# Patient Record
Sex: Male | Born: 1970 | Hispanic: No | Marital: Single | Smoking: Current every day smoker
Health system: Southern US, Community
[De-identification: ages and names within clinical notes are randomized; demographics above are authoritative.]

## PROBLEM LIST (undated history)

## (undated) DIAGNOSIS — J449 Chronic obstructive pulmonary disease, unspecified: Secondary | ICD-10-CM

## (undated) DIAGNOSIS — E785 Hyperlipidemia, unspecified: Secondary | ICD-10-CM

## (undated) DIAGNOSIS — IMO0002 Reserved for concepts with insufficient information to code with codable children: Secondary | ICD-10-CM

## (undated) DIAGNOSIS — G8929 Other chronic pain: Secondary | ICD-10-CM

## (undated) DIAGNOSIS — R454 Irritability and anger: Secondary | ICD-10-CM

## (undated) DIAGNOSIS — K649 Unspecified hemorrhoids: Secondary | ICD-10-CM

## (undated) DIAGNOSIS — G4730 Sleep apnea, unspecified: Secondary | ICD-10-CM

## (undated) DIAGNOSIS — F1910 Other psychoactive substance abuse, uncomplicated: Secondary | ICD-10-CM

## (undated) DIAGNOSIS — M5090 Cervical disc disorder, unspecified, unspecified cervical region: Secondary | ICD-10-CM

## (undated) DIAGNOSIS — J45909 Unspecified asthma, uncomplicated: Secondary | ICD-10-CM

## (undated) DIAGNOSIS — I10 Essential (primary) hypertension: Secondary | ICD-10-CM

## (undated) DIAGNOSIS — E559 Vitamin D deficiency, unspecified: Secondary | ICD-10-CM

## (undated) DIAGNOSIS — K219 Gastro-esophageal reflux disease without esophagitis: Secondary | ICD-10-CM

## (undated) DIAGNOSIS — M1990 Unspecified osteoarthritis, unspecified site: Secondary | ICD-10-CM

## (undated) HISTORY — DX: Other chronic pain: G89.29

## (undated) HISTORY — DX: Essential (primary) hypertension: I10

## (undated) HISTORY — DX: Irritability and anger: R45.4

## (undated) HISTORY — DX: Unspecified osteoarthritis, unspecified site: M19.90

## (undated) HISTORY — PX: UPPER GASTROINTESTINAL ENDOSCOPY: SHX188

## (undated) HISTORY — DX: Other psychoactive substance abuse, uncomplicated: F19.10

## (undated) HISTORY — DX: Reserved for concepts with insufficient information to code with codable children: IMO0002

## (undated) HISTORY — DX: Gastro-esophageal reflux disease without esophagitis: K21.9

## (undated) HISTORY — DX: Cervical disc disorder, unspecified, unspecified cervical region: M50.90

## (undated) HISTORY — DX: Vitamin D deficiency, unspecified: E55.9

## (undated) HISTORY — DX: Unspecified asthma, uncomplicated: J45.909

## (undated) HISTORY — DX: Sleep apnea, unspecified: G47.30

## (undated) HISTORY — DX: Chronic obstructive pulmonary disease, unspecified: J44.9

## (undated) HISTORY — DX: Hyperlipidemia, unspecified: E78.5

## (undated) HISTORY — DX: Unspecified hemorrhoids: K64.9

---
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
## 1994-03-08 HISTORY — PX: ANKLE SURGERY: SHX546

## 1994-03-08 HISTORY — PX: LEG SURGERY: SHX1003

## 2007-06-21 ENCOUNTER — Emergency Department (HOSPITAL_COMMUNITY): Admission: EM | Admit: 2007-06-21 | Discharge: 2007-06-22 | Payer: Self-pay | Admitting: Emergency Medicine

## 2008-06-23 ENCOUNTER — Observation Stay (HOSPITAL_COMMUNITY): Admission: EM | Admit: 2008-06-23 | Discharge: 2008-06-24 | Payer: Self-pay | Admitting: Emergency Medicine

## 2010-02-06 IMAGING — CT CT HEAD W/O CM
1 series · 16 of 30 positions shown, 20 images · non-contrast
Comparison: None

CLINICAL DATA: Syncope

CT HEAD WITHOUT CONTRAST
TECHNIQUE: Contiguous axial images were obtained from the base of
the skull through the vertex without contrast.

[Series 2: head routine 4.8 h37s · axial · 0.51mm/px · z∈[-87,+54]mm · 16 of 30 slices shown, 20 images]
[im 2/30  brain]
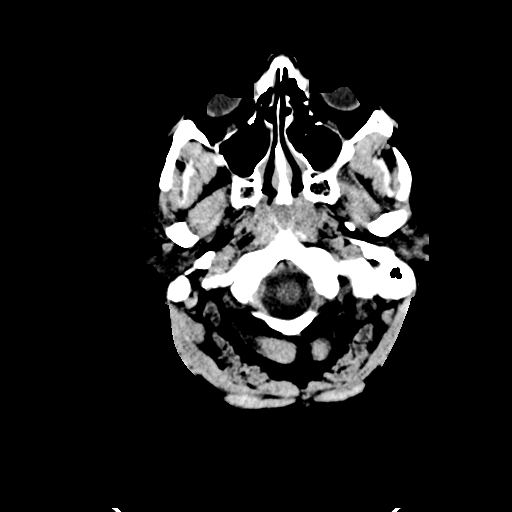
[im 2/30  bone]
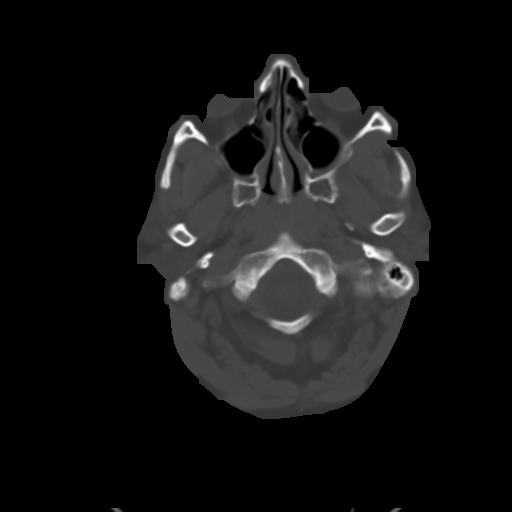
[im 4/30  brain]
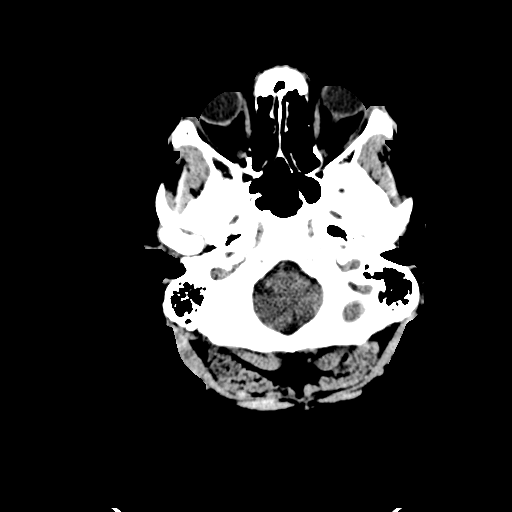
[im 6/30  brain]
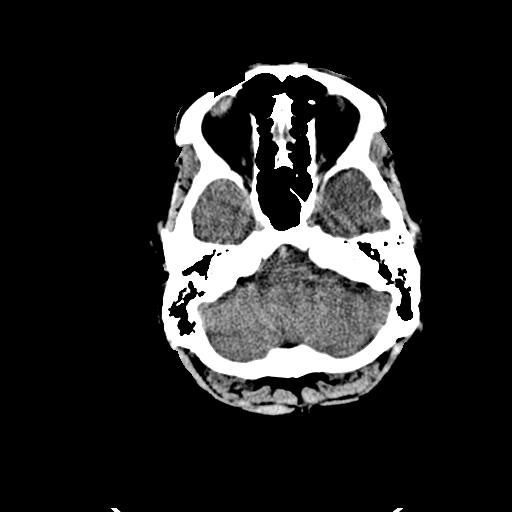
[im 8/30  brain]
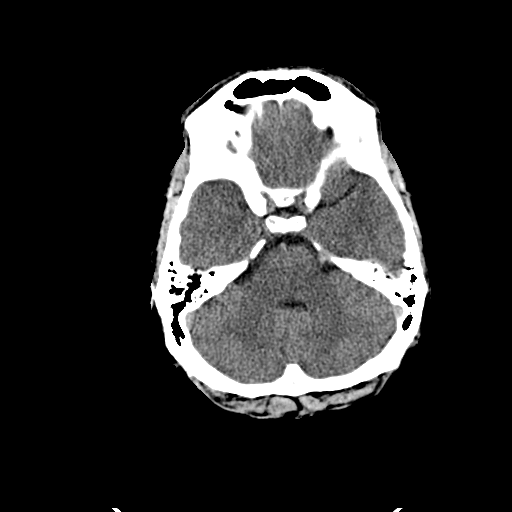
[im 9/30  brain]
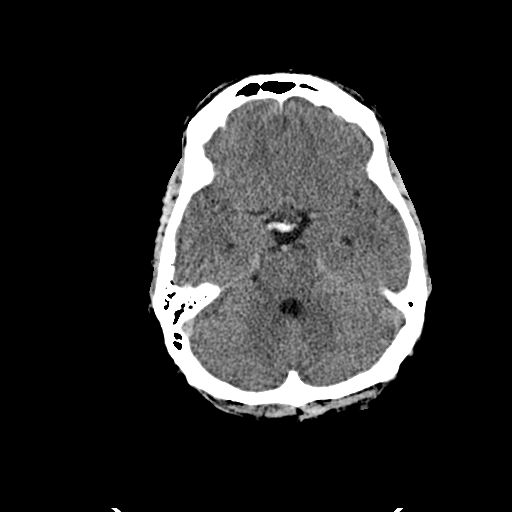
[im 9/30  bone]
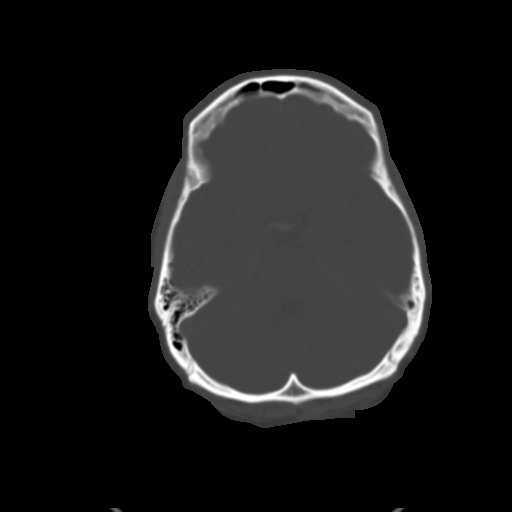
[im 11/30  brain]
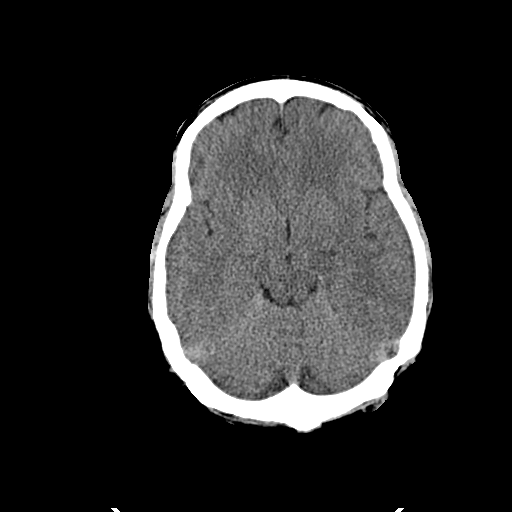
[im 13/30  brain]
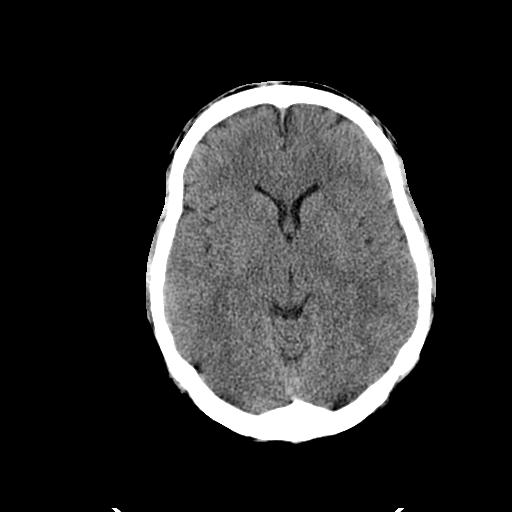
[im 15/30  brain]
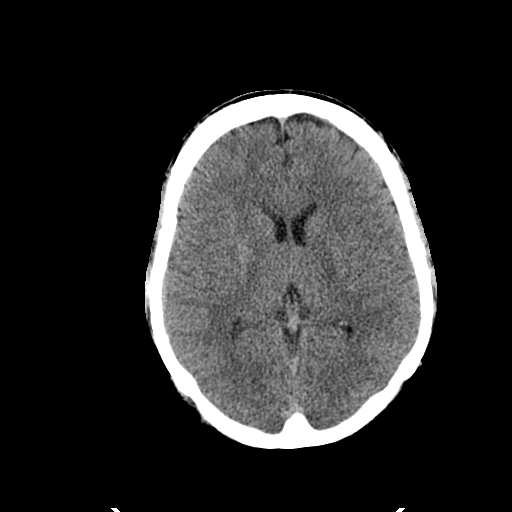
[im 16/30  brain]
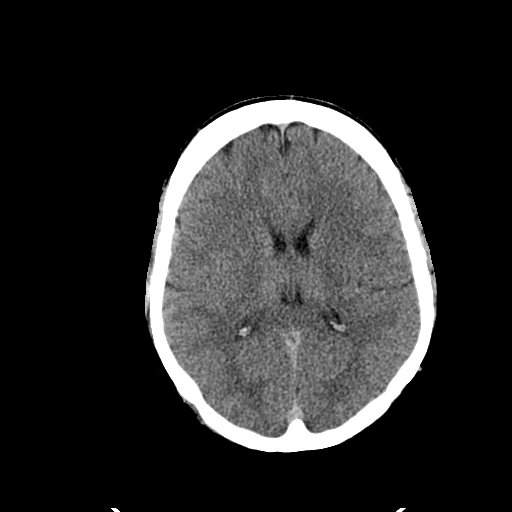
[im 16/30  bone]
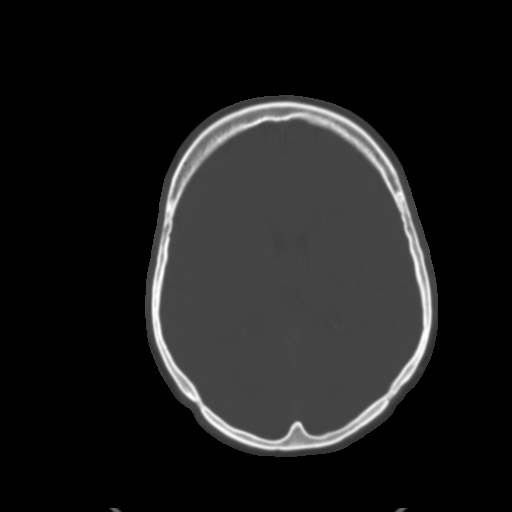
[im 18/30  brain]
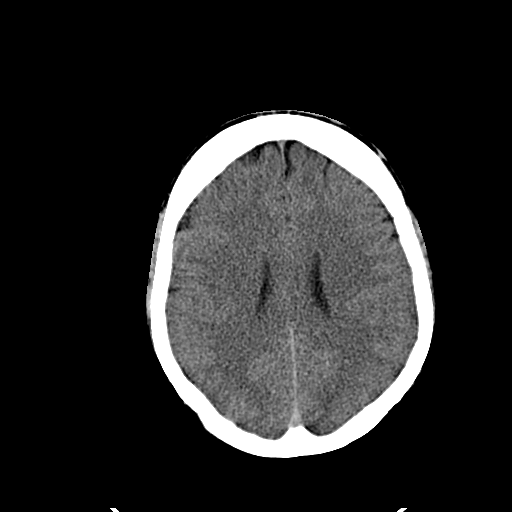
[im 20/30  brain]
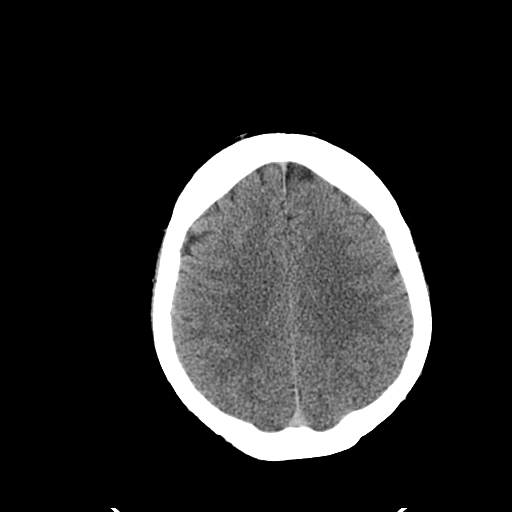
[im 22/30  brain]
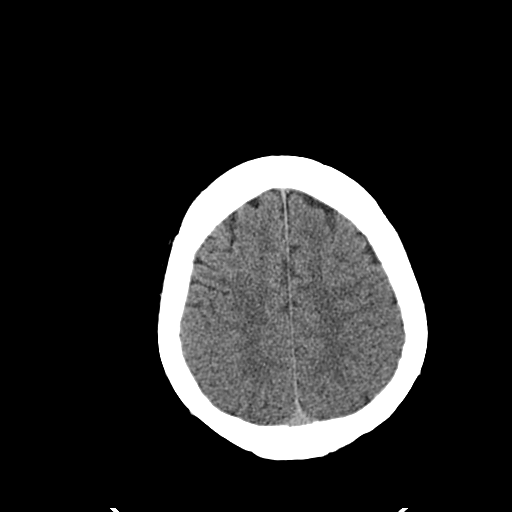
[im 23/30  brain]
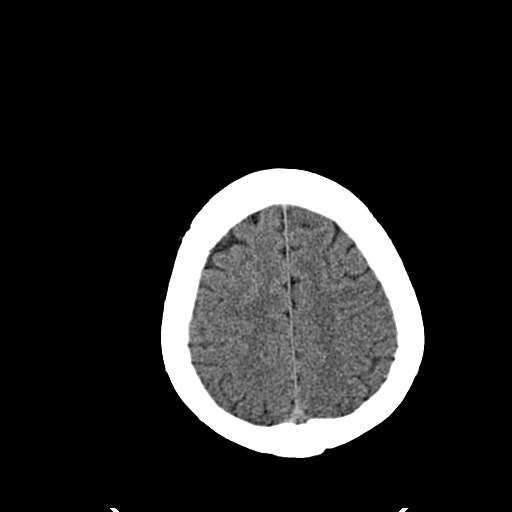
[im 23/30  bone]
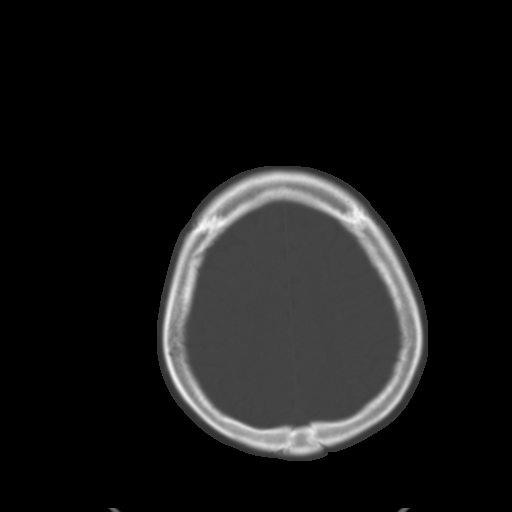
[im 25/30  brain]
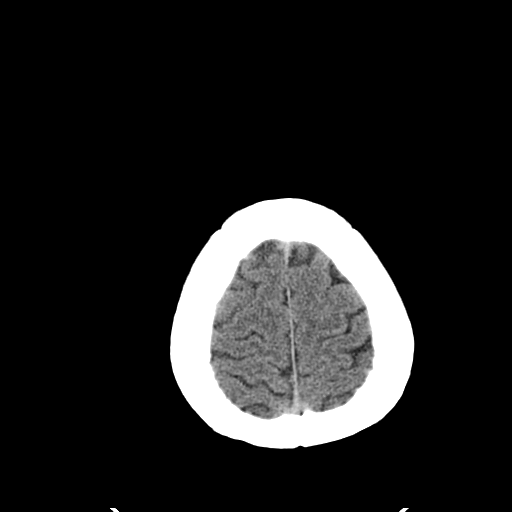
[im 27/30  brain]
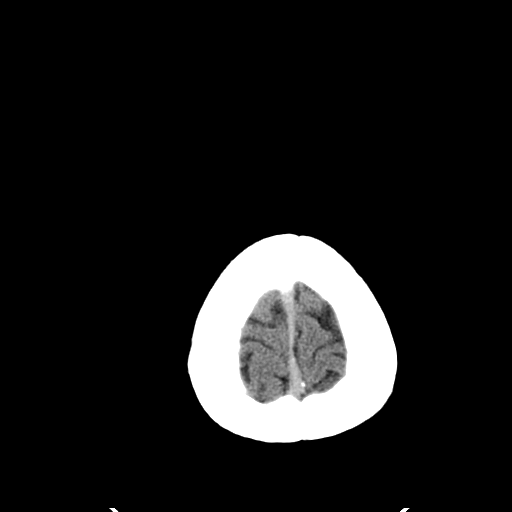
[im 29/30  brain]
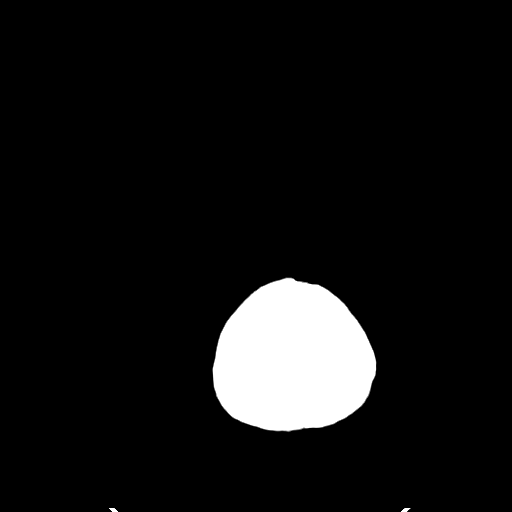

[16 of 30 positions shown; findings below may reference images not displayed]

FINDINGS: No depressed skull fracture is noted.  No intracranial
hemorrhage, mass effect or midline shift.

Paranasal sinuses and mastoid air cells are unremarkable.  No intra
or extra-axial fluid collection.  No mass lesion is noted.  No
definite acute cortical infarct.
IMPRESSION: No acute intracranial abnormality.

## 2010-06-17 LAB — LIPID PANEL
Total CHOL/HDL Ratio: 6.4 RATIO
Triglycerides: 158 mg/dL — ABNORMAL HIGH (ref <150)
VLDL: 32 mg/dL (ref 0–40)

## 2010-06-17 LAB — RAPID URINE DRUG SCREEN, HOSP PERFORMED
Barbiturates: NOT DETECTED
Benzodiazepines: NOT DETECTED
Cocaine: NOT DETECTED
Opiates: NOT DETECTED

## 2010-06-17 LAB — CBC
HCT: 41.7 % (ref 39.0–52.0)
Hemoglobin: 15.3 g/dL (ref 13.0–17.0)
MCHC: 35 g/dL (ref 30.0–36.0)
MCV: 94.7 fL (ref 78.0–100.0)
Platelets: 241 10*3/uL (ref 150–400)
RBC: 4.67 MIL/uL (ref 4.22–5.81)
RDW: 13.8 % (ref 11.5–15.5)
WBC: 8.6 10*3/uL (ref 4.0–10.5)

## 2010-06-17 LAB — POCT CARDIAC MARKERS
CKMB, poc: <1 ng/mL — ABNORMAL LOW (ref 1.0–8.0)
Myoglobin, poc: 67.9 ng/mL (ref 12–200)

## 2010-06-17 LAB — CARDIAC PANEL(CRET KIN+CKTOT+MB+TROPI)
Relative Index: INVALID (ref 0.0–2.5)
Troponin I: 0.04 ng/mL (ref 0.00–0.06)

## 2010-06-17 LAB — URINALYSIS, ROUTINE W REFLEX MICROSCOPIC
Bilirubin Urine: NEGATIVE
Hgb urine dipstick: NEGATIVE
Hgb urine dipstick: NEGATIVE
Ketones, ur: 15 mg/dL — AB
Ketones, ur: NEGATIVE mg/dL
Nitrite: NEGATIVE
Protein, ur: 30 mg/dL — AB
Protein, ur: NEGATIVE mg/dL
Urobilinogen, UA: 1 mg/dL (ref 0.0–1.0)
Urobilinogen, UA: 1 mg/dL (ref 0.0–1.0)

## 2010-06-17 LAB — BRAIN NATRIURETIC PEPTIDE: Pro B Natriuretic peptide (BNP): <30 pg/mL (ref 0.0–100.0)

## 2010-06-17 LAB — BASIC METABOLIC PANEL
CO2: 22 mEq/L (ref 19–32)
Calcium: 8.7 mg/dL (ref 8.4–10.5)
Chloride: 104 mEq/L (ref 96–112)
Chloride: 108 mEq/L (ref 96–112)
Creatinine, Ser: 1.01 mg/dL (ref 0.4–1.5)
GFR calc Af Amer: >60 mL/min (ref >60)
GFR calc Af Amer: >60 mL/min (ref >60)
GFR calc non Af Amer: >60 mL/min (ref >60)
Potassium: 3.6 mEq/L (ref 3.5–5.1)
Sodium: 134 mEq/L — ABNORMAL LOW (ref 135–145)

## 2010-06-17 LAB — CORTISOL-AM, BLOOD: Cortisol - AM: 3.1 ug/dL — ABNORMAL LOW (ref 4.3–22.4)

## 2010-06-17 LAB — URINE CULTURE
Colony Count: NO GROWTH
Culture: NO GROWTH

## 2010-06-17 LAB — TSH: TSH: 0.529 u[IU]/mL (ref 0.350–4.500)

## 2010-06-17 LAB — TROPONIN I: Troponin I: 0.05 ng/mL (ref 0.00–0.06)

## 2010-06-17 LAB — DIFFERENTIAL
Basophils Relative: 1 % (ref 0–1)
Monocytes Absolute: 0.9 10*3/uL (ref 0.1–1.0)
Monocytes Relative: 7 % (ref 3–12)
Neutro Abs: 10.8 10*3/uL — ABNORMAL HIGH (ref 1.7–7.7)

## 2010-06-17 LAB — SALICYLATE LEVEL: Salicylate Lvl: <4 mg/dL (ref 2.8–20.0)

## 2010-06-17 LAB — MAGNESIUM: Magnesium: 2.2 mg/dL (ref 1.5–2.5)

## 2010-06-17 LAB — HEMOCCULT GUIAC POC 1CARD (OFFICE): Fecal Occult Bld: NEGATIVE

## 2010-06-17 LAB — APTT: aPTT: 30 seconds (ref 24–37)

## 2010-06-17 LAB — URINE MICROSCOPIC-ADD ON

## 2010-07-21 NOTE — Discharge Summary (Signed)
NAME:  Ryan Carpenter, Ryan Carpenter NO.:  [id number]   MEDICAL RECORD NO.:  [id number]          PATIENT TYPE:  OBV   LOCATION:  3732                         FACILITY:  [facility]   PHYSICIAN:  Monte Fantasia, MD  DATE OF BIRTH:  1970-08-17   DATE OF ADMISSION:  06/23/2008  DATE OF DISCHARGE:  06/24/2008                               DISCHARGE SUMMARY   PRIMARY CARE PHYSICIAN:  The patient follows up with [facility] but  currently is unassigned.   DISCHARGE DIAGNOSES:  1. Dehydration.  2. Syncope, possibly secondary to dehydration.  3. Acute renal insufficiency, which has resolved.  4. Migraine headaches.   DISCHARGE MEDICATIONS:  1. Prevacid 30 mg p.o. daily for 4 weeks.  2. Amitriptyline 25 mg p.o. nightly p.r.n. migraine headaches.   COURSE DURING THE HOSPITAL STAY:  Mr. Ryan Carpenter is a 40 year old African  American veteran.  The present was admitted on June 23, 2008, with  complaints of a syncopal episode.  The patient stated that he took  medications, which was Exforge, amlodipine 5 mg and valsartan 160 mg,  for his headache when given by his father and later had felt uneasy  throughout the day and felt dizzy when watching the television with  lightheadedness.  The patient on arrival to the [facility] Emergency,  blood pressure was in 80s/40s.  The patient was given aggressively IV  fluid hydration, which brought his blood pressure up to 112/66.  The  patient was admitted for telemetry monitoring and was positive on his  orthostatics.  The patient had no telemetry events over the past 24  hours.  Cardiac enzymes cycled during the stay in the hospital, 3 sets  have been negative.  Also, the patient had elevated white count on  admission, which could be possibly secondary to dehydration, which has  resolved at present.  H and H have been stable through the stay in the  hospital, and renal insufficiency improved with IV fluids from 1.5 to 1.  The patient at present does  not feel any episodes of lightheadedness or  dizziness and had had no telemetry events through the stay in the  hospital.  At present, the patient is medically stable to be discharged  and can be discharged home.  The patient is recommended to follow up  with [facility] or a primary care physician in the next 1 week.   LABORATORY DATA DONE DURING THE STAY IN THE HOSPITAL:  Total WBC 8.6,  improved from 14; hemoglobin 14.4; hematocrit 41.7; platelets of 241.  Sodium 138, potassium 3.6, chloride 108, bicarb 26, glucose 106, BUN 12,  creatinine 1, calcium 8.7, magnesium 2.2.  Cardiac enzymes x3 sets have  been negative.  HIV nonreactive.  Total cholesterol 191, triglycerides  158, HDL 30, LDL 129.  TSH 0.529.  The patient's UTOX was positive for  marijuana.  UA has been negative.   RADIOLOGICAL INVESTIGATION DONE DURING THE STAY IN THE HOSPITAL:  1. CT head done on June 23, 2008.  Impression:  No acute intracranial abnormality.  1. Chest x-ray done on June 23, 2008.  Impression:  No acute cardiopulmonary events.   PHYSICAL EXAMINATION:  VITAL SIGNS:  Temperature of 96.8, heart rate of  69, respirations 18, blood pressure 120/71.  NECK:  Supple.  HEENT:  Pupils equal, reacting to light.  No pallor.  No  lymphadenopathy.  RESPIRATORY:  Air entry is bilaterally equal.  No rales.  No rhonchi.  CARDIOVASCULAR:  S1 and S2 regular rate and rhythm.  ABDOMEN:  Soft.  No guarding or rigidity.  No tenderness.  EXTREMITIES:  No edema of feet.  CENTRAL NERVOUS SYSTEM:  The patient is alert, awake, oriented x3.  No  focal neurological deficits.  SKIN:  Intact.  No evidence of rashes.   DISPOSITION:  The patient at present is medically stable to be  discharged and recommended to follow up with [facility] for a  primary care physician, and the patient has acknowledged the same.  The  patient is no longer orthostatic.  Possibly syncopal episode was  secondary to dehydration.    TOTAL TIME FOR DISCHARGE:  35 minutes.      Monte Fantasia, MD  Electronically Signed     MP/MEDQ  D:  06/24/2008  T:  06/25/2008  Job:  [id number]

## 2010-07-21 NOTE — H&P (Signed)
NAME:  Ryan Carpenter, Ryan Carpenter NO.:  [id number]   MEDICAL RECORD NO.:  [id number]          PATIENT TYPE:  EMS   LOCATION:  MAJO                         FACILITY:  [facility]   PHYSICIAN:  Acey Lav, MD  DATE OF BIRTH:  02/09/1971   DATE OF ADMISSION:  06/23/2008  DATE OF DISCHARGE:                              HISTORY & PHYSICAL   He does not have a primary care physician.  He was previously seen by  the [facility] health care system in [place] and in [place].   CHIEF COMPLAINT:  Syncopal episode.   HISTORY OF PRESENT ILLNESS:  Ryan Carpenter is a 40 year old [place] Native  American veteran who presents with syncope this morning.  He has had  four prior episodes of syncope that have happened over the last 10  years.  This has been worked up at the [place], but according to the patient,  not as an inpatient.  He claims he has had MRIs of his brain performed  and various other tests.  He suffers from chronic migraine headaches as  well.  Of note, the patient states that approximately 10 years ago when  he had his first syncopal episode, he had been on the couch after a day  of being out in the sun and was dehydrated.  He was getting up from the  couch to walk and he felt suddenly diaphoretic and dizzy and passed out.   He has had three additional episodes of syncope.  One occurred when he  was walking down his hallway and he suddenly passed out.  Another  occurred when he was in the woods fishing with friends.  At that point  in time it was hot in the summer and he thinks he may have been  dehydrated.  He again became profusely diaphoretic and lost  consciousness.  This morning he passed out once again.  Recently he has  had severe migraine headaches and has been taking Excedrin up to 10-15  times a day.  He currently has not been able to take any prophylactic  medications (he used to take Verapamil) and has not been able to afford  other medications such as Imitrex which he  states used to work well for  his migraine headaches.  His migraine headaches have been 10/10 in  severity, relieved with Excedrin although as mentioned, he has required  up to 10-14 a day to apparently relieve this.  He has had frequent  vomiting over the last several weeks and at times daily vomiting.  Vomitus usually contains mucous but occasionally has flecks of blood in  it.  He vomited last night.  This morning he was having severe headaches  again.  His father came to get him to go fishing.  His father suggested  that he take his father's prescription medication Exforge, which is 5 mg  of amlodipine and 160 mg of Valsartan.  After taking this he proceeded  to go on the fishing trip with his father.  He started feeling dizzy and  lightheaded.  He felt unwell, felt queasy, nauseous and told his father  he did not feel well enough to fish.  He returned home to his house and  was watching a commercial on TV when he again felt dizzy and  lightheaded.  He stood up to walk out of the house and as walked toward  his wife started having dizziness and seeing the room shrink and get  larger again.  He went back to the couch in the house and lay back in  the recliner.  Apparently at this point in time he passed out as his  found him unconscious.  She apparently observed him twitching and was  concerned he had a seizure.  He was brought to the [facility] emergency  department where upon arrival his blood pressure dipped into the  80s/40s.  He was given intravenous fluids with resuscitation of his  blood pressure to up to 112/66.  His admission labs were significant for  renal insufficiency with a creatinine of 1.5 although his baseline is  unknown.  His tox screen was positive for marijuana.  His alcohol level  was negative.  His acetaminophen level was negative.  His CBC with  differential was significant for a white count of 14,000 and a  hemoglobin of 15.3 and platelets of 247.  His initial  urinalysis shows a  specific gravity of 1.028, a pH of 5.5, small amount of bilirubin and  ketones positive, 30 protein, negative nitrite, positive leukocytes with  3-6 white blood cells and 0-2 red blood cells.  The patient was given  volume in the ER.  He had an EKG done as well, which showed normal sinus  rhythm with some T-wave flattening in V5 and V6, Q-wave in II and small  Q in III and AVF as well as nonpathologic Q in V4, V5 and V6.  We have  no prior EKGs for comparison.  His initial cardiac markers were  negative.   PAST MEDICAL HISTORY:  1. Prior history of heavy alcohol abuse.  He states now he only drinks      a couple of beers every week.  2. Hypertension.  3. Migraine headaches.   SURGICAL HISTORY:  1. History of appendectomy.  2. History of pinning of his right hip and of his right knee.  3. History of an ankle fracture as well.   FAMILY HISTORY:  Mother died of unknown causes.  She did weigh 400  pounds.  Father has suffered some hypertension.   SOCIAL HISTORY:  The patient is a [place] Native American who served in  [employer].  He was previously seen at the [facility]  but states he has been blocked from getting medications.  As mentioned  he was a former heavy drinker.  Now he states he only drinks a few beers  a week.  He does smoke marijuana but denies other recreational drug use.   REVIEW OF SYSTEMS:  As described above in the history of present  illness.  Pertinent for syncope, pertinent for nausea and vomiting  occasionally with blood streaks in the vomitus but typically with mucus.  Significant also for headaches.  Otherwise 10-point review of systems is  negative.   PHYSICAL EXAMINATION:  Blood pressure initially was 104/49 and the  dropped to 87/44, pulse was 72-76.  Recheck on blood pressure 112/66,  pulse 76, respirations 14, pulse oximetry 98% on room air, temperature  maximum 96.6.  GENERAL:  Quite pleasant gentleman in no acute  distress.  HEENT:  Normocephalic, atraumatic.  Pupils equal, round  and reactive to  light.  Sclerae anicteric.  Oropharynx was actually moist.  He has  multiple broken teeth and poor dentition.  NECK:  Supple.  CARDIOVASCULAR:  Regular rate and rhythm.  No murmurs, gallops or rubs.  LUNGS:  Clear to auscultation bilaterally without wheezing, rhonchi or  rales.  ABDOMEN:  Soft, nondistended, nontender.  EXTREMITIES:  Without edema.  NEUROLOGIC EXAM:  Nonfocal.  Cranial  nerves II-XII grossly intact.  Strength symmetric, 5/5 bilaterally.  Sensation intact.   LABORATORY DATA:  Chest x-ray shows no infiltrate.  CT of head without  contrast showed no acute abnormalities.  Urine drug screen positive for  marijuana, 3-6 white blood cells in the urine. UA as previously  described.  Beta-natriuretic peptide less than 30.  Salicylates  negative.  BMP significant for sodium 134, potassium 3.5, chloride 104,  bicarb 22, BUN and creatinine 17 and 1.52, glucose 116.  Calcium 1.7 .  Alcohol level negative.  PTT 30.  PT 30.4.  Cardiac markers were  negative.  CBC with differential:  White count 14.0, hemoglobin 15.3,  platelets 247, ANC of 10.8.   ASSESSMENT/PLAN:  This is a 40 year old [place] Native American gentleman  with history of prior multiple syncopal episodes who has been having  uncontrolled migraine headaches for which he has been taking multiple  Excedrins, has been vomiting and now has a syncopal episode after taking  his father's amlodipine Valsartan.  1. Syncope.  This sounds most consistent with orthostatic hypotension      as the cause of his syncope.  We will check orthostatic vital signs      here.  We will give him fluids.  I will go back and inquire if he      has any symptoms to suggest urinary tract infection.  I did not      hear that upon initial interview.  If he does I will give him      antibiotics for this.  Again we will hydrate him vigorously.  We      will counsel  him to make sure he is always receiving plenty of      fluids.  I will check a baseline cortisol level to screen for      adrenal insufficiency and if this is low, may check a cosyntropin      stem test.  I will cycle his cardiac enzymes and observe him on      telemetry.  We can pursue further workup depending on the results      of this workup.  He claimed he has had prior workup at the [facility]      hospital.  He is anxious about the cost of his stay and worried      about his ability to pay for his care.  2. History of vomiting with blood specks.  The patient states he may      have had peptic ulcer disease.  I am going to put him on a proton      pump inhibitor.  I will guaiac his stools.  We will recheck his CBC      once he has been fully hydrated as I suspect he is      hemoconcentrated.  3. Renal insufficiency may be acute.  We will give him plenty of      volume and recheck his serum creatinine in the morning.  If it is      still elevated we may pursue other  workup at that time.  4. Migraine headaches.  I will give him some Imitrex in the hospital      and oxycodone as needed.  He might benefit from a medication such      as valproic acid for prophylaxis.  I would not give him      antihypertensives for prophylaxis given his history which sounds      highly consistent with orthostatic syncope due to orthostatic      hypotension.  5. Prophylaxis.  I put the patient on sequential compression devices.  6. Code status.  The patient is full code.  7. Pyuria.  It is not clear that this is symptomatic and therefore I      am not going to treat unless the patient gives me symptoms that      suggest that he truly has a urinary tract infection.  8. Leukocytosis.  This is likely due to hemoconcentration.  I will      recheck his laboratories in the morning.   ADDENDUM: I rechecked with the patient and he has had no dysuria,  suprapubic tenderness, fevers or anything to suggest UTI. I will  not  treat him for one.      Acey Lav, MD  Electronically Signed     CV/MEDQ  D:  06/23/2008  T:  06/23/2008  Job:  [id number]

## 2010-12-01 LAB — CBC
HCT: 46.7
Hemoglobin: 16.2
RBC: 4.99
RDW: 13.4

## 2010-12-01 LAB — BASIC METABOLIC PANEL
Calcium: 8.8
GFR calc Af Amer: >60
GFR calc non Af Amer: >60
Glucose, Bld: 117 — ABNORMAL HIGH
Potassium: 3.1 — ABNORMAL LOW
Sodium: 139

## 2010-12-01 LAB — RAPID URINE DRUG SCREEN, HOSP PERFORMED
Amphetamines: NOT DETECTED
Barbiturates: NOT DETECTED
Benzodiazepines: NOT DETECTED
Opiates: NOT DETECTED

## 2010-12-01 LAB — DIFFERENTIAL
Eosinophils Relative: 1
Lymphocytes Relative: 18
Lymphs Abs: 2.8
Monocytes Absolute: 1
Monocytes Relative: 6
Neutro Abs: 11.8 — ABNORMAL HIGH

## 2010-12-01 LAB — URINALYSIS, ROUTINE W REFLEX MICROSCOPIC
Bilirubin Urine: NEGATIVE
Glucose, UA: NEGATIVE
Protein, ur: 100 — AB
Urobilinogen, UA: 0.2

## 2010-12-01 LAB — URINE MICROSCOPIC-ADD ON

## 2015-03-09 HISTORY — PX: WRIST SURGERY: SHX841

## 2015-11-29 ENCOUNTER — Encounter (HOSPITAL_COMMUNITY): Payer: Self-pay | Admitting: Emergency Medicine

## 2015-11-29 ENCOUNTER — Encounter (HOSPITAL_COMMUNITY)
Admission: EM | Disposition: A | Discharge status: Home / Self Care | Payer: Self-pay | Source: Home / Self Care | Attending: Emergency Medicine

## 2015-11-29 ENCOUNTER — Emergency Department (HOSPITAL_COMMUNITY): Payer: Self-pay | Admitting: Certified Registered Nurse Anesthetist

## 2015-11-29 ENCOUNTER — Ambulatory Visit (HOSPITAL_COMMUNITY)
Admission: EM | Admit: 2015-11-29 | Discharge: 2015-11-29 | Disposition: A | Discharge status: Home / Self Care | Payer: Self-pay | Attending: Emergency Medicine | Admitting: Emergency Medicine

## 2015-11-29 DIAGNOSIS — S65112A Laceration of radial artery at wrist and hand level of left arm, initial encounter: Secondary | ICD-10-CM | POA: Insufficient documentation

## 2015-11-29 DIAGNOSIS — S55112A Laceration of radial artery at forearm level, left arm, initial encounter: Secondary | ICD-10-CM

## 2015-11-29 DIAGNOSIS — S61511A Laceration without foreign body of right wrist, initial encounter: Secondary | ICD-10-CM | POA: Diagnosis not present

## 2015-11-29 DIAGNOSIS — Y9289 Other specified places as the place of occurrence of the external cause: Secondary | ICD-10-CM | POA: Insufficient documentation

## 2015-11-29 DIAGNOSIS — S61512A Laceration without foreign body of left wrist, initial encounter: Secondary | ICD-10-CM | POA: Insufficient documentation

## 2015-11-29 DIAGNOSIS — Y990 Civilian activity done for income or pay: Secondary | ICD-10-CM | POA: Insufficient documentation

## 2015-11-29 DIAGNOSIS — W260XXA Contact with knife, initial encounter: Secondary | ICD-10-CM | POA: Insufficient documentation

## 2015-11-29 HISTORY — PX: ARTERY REPAIR: SHX559

## 2015-11-29 LAB — CBC WITH DIFFERENTIAL/PLATELET
BASOS ABS: 0.2 10*3/uL — AB (ref 0.0–0.1)
BASOS PCT: 1 %
EOS ABS: 0.2 10*3/uL (ref 0.0–0.7)
Eosinophils Relative: 1 %
HCT: 48 % (ref 39.0–52.0)
HEMOGLOBIN: 16.8 g/dL (ref 13.0–17.0)
Lymphocytes Relative: 28 %
Lymphs Abs: 3.9 10*3/uL (ref 0.7–4.0)
MCH: 32.6 pg (ref 26.0–34.0)
MCHC: 35 g/dL (ref 30.0–36.0)
MCV: 93.2 fL (ref 78.0–100.0)
MONO ABS: 1.3 10*3/uL — AB (ref 0.1–1.0)
MONOS PCT: 10 %
NEUTROS ABS: 8.4 10*3/uL — AB (ref 1.7–7.7)
NEUTROS PCT: 60 %
Platelets: 296 10*3/uL (ref 150–400)
RBC: 5.15 MIL/uL (ref 4.22–5.81)
RDW: 13.4 % (ref 11.5–15.5)
WBC: 13.9 10*3/uL — ABNORMAL HIGH (ref 4.0–10.5)

## 2015-11-29 LAB — COMPREHENSIVE METABOLIC PANEL WITH GFR
ALT: 16 U/L — ABNORMAL LOW (ref 17–63)
AST: 18 U/L (ref 15–41)
Albumin: 4 g/dL (ref 3.5–5.0)
Alkaline Phosphatase: 55 U/L (ref 38–126)
Anion gap: 16 — ABNORMAL HIGH (ref 5–15)
BUN: 8 mg/dL (ref 6–20)
CO2: 16 mmol/L — ABNORMAL LOW (ref 22–32)
Calcium: 9.3 mg/dL (ref 8.9–10.3)
Chloride: 104 mmol/L (ref 101–111)
Creatinine, Ser: 0.93 mg/dL (ref 0.61–1.24)
GFR calc Af Amer: >60 mL/min
GFR calc non Af Amer: >60 mL/min
Glucose, Bld: 83 mg/dL (ref 65–99)
Potassium: 3.6 mmol/L (ref 3.5–5.1)
Sodium: 136 mmol/L (ref 135–145)
Total Bilirubin: 0.7 mg/dL (ref 0.3–1.2)
Total Protein: 7.8 g/dL (ref 6.5–8.1)

## 2015-11-29 LAB — TYPE AND SCREEN
ABO/RH(D): O POS
Antibody Screen: NEGATIVE

## 2015-11-29 LAB — ABO/RH: ABO/RH(D): O POS

## 2015-11-29 SURGERY — REPAIR, ARTERY, BRACHIAL
Anesthesia: General | Site: Wrist | Laterality: Left

## 2015-11-29 MED ORDER — SUGAMMADEX SODIUM 200 MG/2ML IV SOLN
INTRAVENOUS | Status: DC | PRN
Start: 2015-11-29 — End: 2015-11-29
  Administered 2015-11-29: 500 mg via INTRAVENOUS

## 2015-11-29 MED ORDER — OXYCODONE HCL 5 MG/5ML PO SOLN: 5.0000 mg | Freq: Once | ORAL | Status: AC | PRN

## 2015-11-29 MED ORDER — SUGAMMADEX SODIUM 500 MG/5ML IV SOLN
INTRAVENOUS | Status: AC
Start: 2015-11-29 — End: 2015-11-29
  Filled 2015-11-29: qty 5

## 2015-11-29 MED ORDER — ALBUTEROL SULFATE (2.5 MG/3ML) 0.083% IN NEBU
2.5000 mg | INHALATION_SOLUTION | Freq: Four times a day (QID) | RESPIRATORY_TRACT | Status: DC | PRN
  Administered 2015-11-29: 2.5 mg via RESPIRATORY_TRACT

## 2015-11-29 MED ORDER — CEFAZOLIN SODIUM 1 G IJ SOLR
INTRAMUSCULAR | Status: DC | PRN
  Administered 2015-11-29: 2 g via INTRAMUSCULAR

## 2015-11-29 MED ORDER — ALBUTEROL SULFATE HFA 108 (90 BASE) MCG/ACT IN AERS
INHALATION_SPRAY | RESPIRATORY_TRACT | Status: DC | PRN
  Administered 2015-11-29: 6 via RESPIRATORY_TRACT

## 2015-11-29 MED ORDER — CEFAZOLIN IN D5W 1 GM/50ML IV SOLN: 1.0000 g | INTRAVENOUS | Status: DC

## 2015-11-29 MED ORDER — CEPHALEXIN 500 MG PO CAPS: 500.0000 mg | ORAL_CAPSULE | Freq: Four times a day (QID) | ORAL | 0 refills | Status: DC

## 2015-11-29 MED ORDER — BACITRACIN ZINC 500 UNIT/GM EX OINT
TOPICAL_OINTMENT | CUTANEOUS | Status: DC | PRN
  Administered 2015-11-29: 1 via TOPICAL

## 2015-11-29 MED ORDER — PROPOFOL 10 MG/ML IV BOLUS
INTRAVENOUS | Status: DC | PRN
  Administered 2015-11-29: 50 mg via INTRAVENOUS
  Administered 2015-11-29: 150 mg via INTRAVENOUS

## 2015-11-29 MED ORDER — OXYCODONE-ACETAMINOPHEN 5-325 MG PO TABS: 1.0000 | ORAL_TABLET | ORAL | 0 refills | Status: DC | PRN

## 2015-11-29 MED ORDER — FENTANYL CITRATE (PF) 100 MCG/2ML IJ SOLN
100.0000 ug | Freq: Once | INTRAMUSCULAR | Status: AC
  Administered 2015-11-29: 100 ug via INTRAVENOUS
  Filled 2015-11-29: qty 2

## 2015-11-29 MED ORDER — MIDAZOLAM HCL 5 MG/5ML IJ SOLN
INTRAMUSCULAR | Status: DC | PRN
  Administered 2015-11-29: 2 mg via INTRAVENOUS

## 2015-11-29 MED ORDER — CEFAZOLIN SODIUM 1 G IJ SOLR
INTRAMUSCULAR | Status: AC
  Filled 2015-11-29: qty 20

## 2015-11-29 MED ORDER — SUCCINYLCHOLINE CHLORIDE 200 MG/10ML IV SOSY
PREFILLED_SYRINGE | INTRAVENOUS | Status: AC
  Filled 2015-11-29: qty 10

## 2015-11-29 MED ORDER — ALBUTEROL SULFATE (2.5 MG/3ML) 0.083% IN NEBU
INHALATION_SOLUTION | RESPIRATORY_TRACT | Status: AC
  Filled 2015-11-29: qty 3

## 2015-11-29 MED ORDER — BACITRACIN ZINC 500 UNIT/GM EX OINT
TOPICAL_OINTMENT | CUTANEOUS | Status: AC
  Filled 2015-11-29: qty 28.35

## 2015-11-29 MED ORDER — FENTANYL CITRATE (PF) 100 MCG/2ML IJ SOLN
INTRAMUSCULAR | Status: DC | PRN
  Administered 2015-11-29 (×2): 100 ug via INTRAVENOUS

## 2015-11-29 MED ORDER — SUCCINYLCHOLINE CHLORIDE 20 MG/ML IJ SOLN
INTRAMUSCULAR | Status: DC | PRN
  Administered 2015-11-29: 120 mg via INTRAVENOUS

## 2015-11-29 MED ORDER — DEXAMETHASONE SODIUM PHOSPHATE 4 MG/ML IJ SOLN
INTRAMUSCULAR | Status: DC | PRN
  Administered 2015-11-29: 10 mg via INTRAVENOUS

## 2015-11-29 MED ORDER — ROCURONIUM BROMIDE 10 MG/ML (PF) SYRINGE
PREFILLED_SYRINGE | INTRAVENOUS | Status: AC
  Filled 2015-11-29: qty 10

## 2015-11-29 MED ORDER — MIDAZOLAM HCL 2 MG/2ML IJ SOLN
INTRAMUSCULAR | Status: AC
  Filled 2015-11-29: qty 2

## 2015-11-29 MED ORDER — SODIUM CHLORIDE 0.9 % IV BOLUS (SEPSIS)
1000.0000 mL | Freq: Once | INTRAVENOUS | Status: AC
  Administered 2015-11-29: 1000 mL via INTRAVENOUS

## 2015-11-29 MED ORDER — LIDOCAINE HCL (CARDIAC) 20 MG/ML IV SOLN
INTRAVENOUS | Status: DC | PRN
  Administered 2015-11-29: 100 mg via INTRAVENOUS

## 2015-11-29 MED ORDER — 0.9 % SODIUM CHLORIDE (POUR BTL) OPTIME
TOPICAL | Status: DC | PRN
Start: 2015-11-29 — End: 2015-11-29
  Administered 2015-11-29: 1000 mL

## 2015-11-29 MED ORDER — FENTANYL CITRATE (PF) 100 MCG/2ML IJ SOLN
INTRAMUSCULAR | Status: AC
Start: 2015-11-29 — End: 2015-11-29
  Filled 2015-11-29: qty 4

## 2015-11-29 MED ORDER — TETANUS-DIPHTH-ACELL PERTUSSIS 5-2.5-18.5 LF-MCG/0.5 IM SUSP
0.5000 mL | Freq: Once | INTRAMUSCULAR | Status: AC
  Administered 2015-11-29: 0.5 mL via INTRAMUSCULAR
  Filled 2015-11-29: qty 0.5

## 2015-11-29 MED ORDER — DEXAMETHASONE SODIUM PHOSPHATE 10 MG/ML IJ SOLN
INTRAMUSCULAR | Status: AC
Start: 2015-11-29 — End: 2015-11-29
  Filled 2015-11-29: qty 1

## 2015-11-29 MED ORDER — OXYCODONE HCL 5 MG PO TABS
ORAL_TABLET | ORAL | Status: AC
  Filled 2015-11-29: qty 1

## 2015-11-29 MED ORDER — ROCURONIUM BROMIDE 100 MG/10ML IV SOLN
INTRAVENOUS | Status: DC | PRN
  Administered 2015-11-29: 30 mg via INTRAVENOUS

## 2015-11-29 MED ORDER — HYDROMORPHONE HCL 1 MG/ML IJ SOLN: 0.2500 mg | INTRAMUSCULAR | Status: DC | PRN

## 2015-11-29 MED ORDER — LACTATED RINGERS IV SOLN
INTRAVENOUS | Status: DC
  Administered 2015-11-29 (×2): via INTRAVENOUS

## 2015-11-29 MED ORDER — OXYCODONE HCL 5 MG PO TABS
5.0000 mg | ORAL_TABLET | Freq: Once | ORAL | Status: AC | PRN
  Administered 2015-11-29: 5 mg via ORAL

## 2015-11-29 MED ORDER — LIDOCAINE 2% (20 MG/ML) 5 ML SYRINGE
INTRAMUSCULAR | Status: AC
  Filled 2015-11-29: qty 5

## 2015-11-29 MED ORDER — ONDANSETRON HCL 4 MG/2ML IJ SOLN
4.0000 mg | Freq: Once | INTRAMUSCULAR | Status: AC
  Administered 2015-11-29: 4 mg via INTRAVENOUS
  Filled 2015-11-29: qty 2

## 2015-11-29 MED ORDER — ALBUTEROL SULFATE HFA 108 (90 BASE) MCG/ACT IN AERS
INHALATION_SPRAY | RESPIRATORY_TRACT | Status: AC
  Filled 2015-11-29: qty 6.7

## 2015-11-29 MED ORDER — ONDANSETRON HCL 4 MG/2ML IJ SOLN: 4.0000 mg | Freq: Once | INTRAMUSCULAR | Status: DC | PRN

## 2015-11-29 SURGICAL SUPPLY — 21 items
BANDAGE ELASTIC 4 VELCRO ST LF (GAUZE/BANDAGES/DRESSINGS) ×2 IMPLANT
BNDG GAUZE ELAST 4 BULKY (GAUZE/BANDAGES/DRESSINGS) ×2 IMPLANT
CANISTER SUCTION 2500CC (MISCELLANEOUS) ×3 IMPLANT
CUFF TOURNIQUET SINGLE 18IN (TOURNIQUET CUFF) IMPLANT
ELECT REM PT RETURN 9FT ADLT (ELECTROSURGICAL) ×3
ELECTRODE REM PT RTRN 9FT ADLT (ELECTROSURGICAL) ×1 IMPLANT
GAUZE SPONGE 4X4 12PLY STRL (GAUZE/BANDAGES/DRESSINGS) ×2 IMPLANT
GLOVE BIO SURGEON STRL SZ7.5 (GLOVE) ×3 IMPLANT
GLOVE BIOGEL PI IND STRL 8 (GLOVE) ×1 IMPLANT
GLOVE BIOGEL PI INDICATOR 8 (GLOVE) ×2
GOWN STRL REUS W/ TWL LRG LVL3 (GOWN DISPOSABLE) ×3 IMPLANT
GOWN STRL REUS W/TWL LRG LVL3 (GOWN DISPOSABLE) ×9
KIT BASIN OR (CUSTOM PROCEDURE TRAY) ×3 IMPLANT
KIT ROOM TURNOVER OR (KITS) ×3 IMPLANT
NS IRRIG 1000ML POUR BTL (IV SOLUTION) ×3 IMPLANT
PACK ORTHO EXTREMITY (CUSTOM PROCEDURE TRAY) ×2 IMPLANT
PAD ARMBOARD 7.5X6 YLW CONV (MISCELLANEOUS) ×6 IMPLANT
SUT ETHILON 3 0 PS 1 (SUTURE) ×2 IMPLANT
SUT VIC AB 3-0 SH 27 (SUTURE) ×3
SUT VIC AB 3-0 SH 27X BRD (SUTURE) IMPLANT
UNDERPAD 30X30 (UNDERPADS AND DIAPERS) ×3 IMPLANT

## 2015-11-29 NOTE — ED Provider Notes (Signed)
[facility]-EMERGENCY DEPT Provider Note   CSN: [id number] Arrival date & time: 11/29/15  1216     History   Chief Complaint Chief Complaint  Patient presents with  . 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  He  rates his pain 0/10.  Wrist is bleeding through EMS dressing.    HPI Ryan Carpenter is a 45 y.o. male who presents emergency Department with chief complaint of left wrist. laceration. 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 He states that he had immediate significant blood loss and states he thinks he lost about a quart of blood within the first 30 seconds. He noticed that he is blood was squirting out of the wrist at high velocity. He denies lightheadedness or shortness of breath. The patient is unsure of his last tetanus vaccination. He has a strong grip strength and can move all his fingers. He denies any numbness or tingling. HPI  History reviewed. No pertinent past medical history.  There are no active problems to display for this patient.   History reviewed. No pertinent surgical history.     Home Medications    Prior to Admission medications   Medication Sig Start Date End Date Taking? Authorizing Provider  cephALEXin (KEFLEX) 500 MG capsule Take 1 capsule (500 mg total) by mouth 4 (four) times daily. 11/29/15   Chuck Hint, MD  oxyCODONE-acetaminophen (ROXICET) 5-325 MG tablet Take 1-2 tablets by mouth every 4 (four) hours as needed. 11/29/15   Chuck Hint, MD    Family History History reviewed. No pertinent family history.  Social History Social History  Substance Use Topics  . Smoking status: Current Some Day Smoker  . Smokeless tobacco: Current User  . Alcohol use Yes     Allergies   Review of patient's allergies indicates not on file.   Review of Systems Review of Systems  Ten systems reviewed and are negative for acute change, except as noted in the  HPI.   Physical Exam Updated Vital Signs BP (!) 157/89   Pulse 81   Temp 98.2 F (36.8 C)   Resp 17   Ht 5\' 7"  (1.702 m)   Wt 90.7 kg   SpO2 95%   BMI 31.32 kg/m   Physical Exam  Constitutional: He appears well-developed and well-nourished. No distress.  HENT:  Head: Normocephalic and atraumatic.  Eyes: Conjunctivae are normal. No scleral icterus.  Neck: Normal range of motion. Neck supple.  Cardiovascular: Normal rate, regular rhythm and normal heart sounds.   Pulmonary/Chest: Effort normal and breath sounds normal. No respiratory distress.  Abdominal: Soft. There is no tenderness.  Musculoskeletal: He exhibits no edema.  5 cm left lip breast laceration. Patient has active high velocity bleeding out of the radial artery. He has full range of motion of the wrist and fingers with a strong grip strength. No abnormal sensation. Cap refill less than 3.  Neurological: He is alert.  Skin: Skin is warm and dry. He is not diaphoretic.  Psychiatric: His behavior is normal.  Nursing note and vitals reviewed.    ED Treatments / Results  Labs (all labs ordered are listed, but only abnormal results are displayed) Labs Reviewed  CBC WITH DIFFERENTIAL/PLATELET - Abnormal; Notable for the following:       Result Value   WBC 13.9 (*)    Neutro Abs 8.4 (*)    Monocytes Absolute 1.3 (*)  Basophils Absolute 0.2 (*)    All other components within normal limits  COMPREHENSIVE METABOLIC PANEL - Abnormal; Notable for the following:    CO2 16 (*)    ALT 16 (*)    Anion gap 16 (*)    All other components within normal limits  TYPE AND SCREEN  ABO/RH    EKG  EKG Interpretation None       Radiology No results found.  Procedures .Critical Care Performed by: Arthor CaptainHARRIS, Koron Godeaux Authorized by: Arthor CaptainHARRIS, Ayona Yniguez   Critical care provider statement:    Critical care time (minutes):  30   Critical care time was exclusive of:  Separately billable procedures and treating other patients    Critical care was necessary to treat or prevent imminent or life-threatening deterioration of the following conditions: potential for loss of limb, arterial hemorrhage.   Critical care was time spent personally by me on the following activities:  Development of treatment plan with patient or surrogate, discussions with consultants, evaluation of patient's response to treatment, examination of patient, ordering and performing treatments and interventions, pulse oximetry and re-evaluation of patient's condition   (including critical care time)  Medications Ordered in ED Medications  Tdap (BOOSTRIX) injection 0.5 mL (0.5 mLs Intramuscular Given 11/29/15 1320)  fentaNYL (SUBLIMAZE) injection 100 mcg (100 mcg Intravenous Given 11/29/15 1322)  ondansetron (ZOFRAN) injection 4 mg (4 mg Intravenous Given 11/29/15 1321)  sodium chloride 0.9 % bolus 1,000 mL (1,000 mLs Intravenous New Bag/Given 11/29/15 1322)  oxyCODONE (Oxy IR/ROXICODONE) immediate release tablet 5 mg (5 mg Oral Given 11/29/15 1552)    Or  oxyCODONE (ROXICODONE) 5 MG/5ML solution 5 mg ( Oral See Alternative 11/29/15 1552)     Initial Impression / Assessment and Plan / ED Course  I have reviewed the triage vital signs and the nursing notes.  Pertinent labs & imaging results that were available during my care of the patient were reviewed by me and considered in my medical decision making (see chart for details).  Clinical Course    Patient with left radial artery transection. I spoke with Dr. Durwin Noraixon who is evaluated. The patient was taken to the OR. Patient informed of decision. We are holding direct pressure on the wound..  Final Clinical Impressions(s) / ED Diagnoses   Final diagnoses:  Laceration of left radial artery, initial encounter    New Prescriptions Discharge Medication List as of 11/29/2015  3:47 PM    START taking these medications   Details  cephALEXin (KEFLEX) 500 MG capsule Take 1 capsule (500 mg total) by mouth  4 (four) times daily., Starting Sat 11/29/2015, Print    oxyCODONE-acetaminophen (ROXICET) 5-325 MG tablet Take 1-2 tablets by mouth every 4 (four) hours as needed., Starting Sat 11/29/2015, Print         Arthor Captainbigail Tomesha Sargent, PA-C 11/29/15 2122    Lyndal Pulleyaniel Knott, MD 11/30/15 [phone]

## 2015-11-29 NOTE — Anesthesia Procedure Notes (Signed)
Procedure Name: Intubation Date/Time: 11/29/2015 2:32 PM Performed by: Reine JustFLOWERS, Jacklin Zwick T Pre-anesthesia Checklist: Patient identified, Emergency Drugs available, Suction available, Patient being monitored and Timeout performed Patient Re-evaluated:Patient Re-evaluated prior to inductionOxygen Delivery Method: Circle system utilized and Simple face mask Preoxygenation: Pre-oxygenation with 100% oxygen Intubation Type: IV induction and Cricoid Pressure applied Laryngoscope Size: Miller and 3 Grade View: Grade II Tube type: Oral Tube size: 7.5 mm Number of attempts: 1 Airway Equipment and Method: Patient positioned with wedge pillow and Stylet Placement Confirmation: ETT inserted through vocal cords under direct vision,  positive ETCO2 and breath sounds checked- equal and bilateral Secured at: 21 cm Tube secured with: Tape Dental Injury: Teeth and Oropharynx as per pre-operative assessment

## 2015-11-29 NOTE — Op Note (Signed)
    NAME: Ryan IvanJames A Dase    MRN: [id number] DOB: February 07, 1971    DATE OF OPERATION: 11/29/2015  PREOP DIAGNOSIS: Laceration left wrist  POSTOP DIAGNOSIS: Same  PROCEDURE: Exploration of laceration left wrist, ligation of left radial artery and adjacent veins.  SURGEON: Di KindleChristopher S. Edilia Boickson, MD, FACS  ASSNone  ANEGeneral   EBLMinimal  INDICATIONS: Ryan Carpenter is a 45 y.o. male who sustained an injury to his left wrist and had significant bleeding at the scene. He was brought to the emergency department by EMS. He is brought to the operating room for exploration of the wound.   FINDINGS:he had a strong ulnar signal and palmar arch signal with bleeding from the distal aspect of the transected left radial artery. This reason I elected to ligate the radial artery as there was excellent perfusion.  TECHNIQUE: The patient was taken to the operating room with pressure held over the wound for hemostasis. The patient received a general anesthetic. The left arm was prepped and draped in usual sterile fashion. Upon exploration of the wound there was bleeding from both ends of the transected radial artery which was very small. There was excellent bleeding from the distal aspect suggesting a patent palmar arch. There was an excellent ulnar signal with the Doppler and also a palmar arch signal with the Doppler. I therefore elected to ligate both ends of the radial artery. This was done with 2-0 silk. The adjacent veins were also ligated as these 2 were transected. The wound was irrigated with copious amounts of saline. I then closed with a  deep layer of interrupted 3-0 Vicryl. The skin was closed with interrupted 3-0 nylon's. Sterile dressing was applied. The patient tolerated the procedure well and was transferred to the recovery room in stable condition. All needle and sponge counts were correct.    Waverly Ferrarihristopher Alexa Blish, MD, FACS [facility]  DATE OF DICTATION:    11/29/2015

## 2015-11-29 NOTE — Anesthesia Preprocedure Evaluation (Addendum)
Anesthesia Evaluation  Patient identified by MRN, date of birth, ID band Patient awake    Reviewed: Allergy & Precautions, NPO status , Patient's Chart, lab work & pertinent test results  Airway Mallampati: II  TM Distance: >3 FB Neck ROM: Full    Dental  (+) Partial Upper, Dental Advisory Given   Pulmonary asthma , Current Smoker,    breath sounds clear to auscultation (-) decreased breath sounds      Cardiovascular  Rhythm:Regular Rate:Normal     Neuro/Psych    GI/Hepatic   Endo/Other    Renal/GU      Musculoskeletal   Abdominal   Peds  Hematology   Anesthesia Other Findings   Reproductive/Obstetrics                            Anesthesia Physical Anesthesia Plan  ASA: II and emergent  Anesthesia Plan: General   Post-op Pain Management:    Induction: Intravenous, Rapid sequence and Cricoid pressure planned  Airway Management Planned: Oral ETT  Additional Equipment:   Intra-op Plan:   Post-operative Plan: Extubation in OR  Informed Consent: I have reviewed the patients History and Physical, chart, labs and discussed the procedure including the risks, benefits and alternatives for the proposed anesthesia with the patient or authorized representative who has indicated his/her understanding and acceptance.   Dental advisory given  Plan Discussed with: CRNA and Anesthesiologist  Anesthesia Plan Comments:         Anesthesia Quick Evaluation

## 2015-11-29 NOTE — ED Triage Notes (Addendum)
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  He  rates his pain 0/10.  Wrist is bleeding through EMS dressing.  Patient did not get any meds with EMS.   Patient did complain of chest pain on the way to ED and EMS did a 12lead and it was normal.

## 2015-11-29 NOTE — ED Notes (Signed)
Report given to Kristi, RN.

## 2015-11-29 NOTE — Transfer of Care (Signed)
Immediate Anesthesia Transfer of Care Note  Patient: Ryan Carpenter  Procedure(s) Performed: Procedure(s): EXPLORATION OF LEFT WRIST LACERATION LIGATION OF RADIAL ARTERY AND RADIAL VIENS. (Left)  Patient Location: PACU  Anesthesia Type:General  Level of Consciousness: awake, alert  and oriented  Airway & Oxygen Therapy: Patient Spontanous Breathing and Patient connected to nasal cannula oxygen  Post-op Assessment: Report given to RN, Post -op Vital signs reviewed and stable and Patient moving all extremities X 4  Post vital signs: Reviewed and stable  Last Vitals:  Vitals:   11/29/15 1315 11/29/15 1330  BP: 151/91 142/87  Pulse: 73 72  Resp: 19 17  Temp:      Last Pain:  Vitals:   11/29/15 1220  TempSrc: Oral         Complications: No apparent anesthesia complications

## 2015-11-29 NOTE — H&P (Signed)
Patient name: Ryan Carpenter MRN: [id number] DOB: 04/07/1970 Sex: male  REASON FOR ADMISSION: Laceration left wrist. Consult is from the emergency department.  HPI: Ryan Carpenter is a 45 y.o. male, who is a Designer, fashion/clothingroofer. He cut his left wrist accidentally with a cutting tool had significant bleeding at the scene. He presents to the emergency department. He was brought to the emergency department by EMS.  The emergency room staff is holding pressure and vascular surgery was consult. He states that he had one beer at a proximally 8 AM and a few glasses of water but his had nothing to eat since last night.  History reviewed. No pertinent past medical history.  He denies any history of diabetes, hypertension, hypercholesterolemia, or cardiac history.  History reviewed. No pertinent family history.  There is no family history of premature cardiovascular disease.  SOCIAL HISTORY: Social History   Social History  . Marital status: Single    Spouse name: N/A  . Number of children: N/A  . Years of education: N/A   Occupational History  . Not on file.   Social History Main Topics  . Smoking status: Current Some Day Smoker  . Smokeless tobacco: Current User  . Alcohol use Yes  . Drug use: Unknown  . Sexual activity: Not on file   Other Topics Concern  . Not on file   Social History Narrative  . No narrative on file    Not on File  No current facility-administered medications for this encounter.    No current outpatient prescriptions on file.    REVIEW OF SYSTEMS:  [X]  denotes positive finding, [ ]  denotes negative finding Cardiac  Comments:  Chest pain or chest pressure:    Shortness of breath upon exertion:    Short of breath when lying flat:    Irregular heart rhythm:        Vascular    Pain in calf, thigh, or hip brought on by ambulation:    Pain in feet at night that wakes you up from your sleep:     Blood clot in your veins:    Leg swelling:           Pulmonary    Oxygen at home:    Productive cough:     Wheezing:         Neurologic    Sudden weakness in arms or legs:     Sudden numbness in arms or legs:     Sudden onset of difficulty speaking or slurred speech:    Temporary loss of vision in one eye:     Problems with dizziness:         Gastrointestinal    Blood in stool:     Vomited blood:         Genitourinary    Burning when urinating:     Blood in urine:        Psychiatric    Major depression:         Hematologic    Bleeding problems:    Problems with blood clotting too easily:        Skin    Rashes or ulcers:        Constitutional    Fever or chills:      PHYSICAL EXAM: Vitals:   11/29/15 1218 11/29/15 1220 11/29/15 1222 11/29/15 1230  BP:    136/86  Pulse:    81  Resp:    (!) 9  Temp:  97.8  F (36.6 C)    TempSrc:  Oral    SpO2: 97%   97%  Weight:   200 lb (90.7 kg)   Height:   5\' 7"  (1.702 m)     GENERAL: The patient is a well-nourished male, in no acute distress. The vital signs are documented above. CARDIAC: There is a regular rate and rhythm.  VASCULAR: There is active bleeding from the left wrist when the wound is inspected and therefore pressure was reapplied. It was difficult to tell whether this was arterial or venous bleeding. PULMONARY: There is good air exchange bilaterally without wheezing or rales. ABDOMEN: Soft and non-tender with normal pitched bowel sounds.  MUSCULOSKELETAL: There are no major deformities or cyanosis. NEUROLOGIC: No focal weakness or paresthesias are detected. He denies significant paresthesias in the left hand and is able to move all of his fingers and grip. SKIN: He has a laceration at his left wrist over the radial artery. PSYCHIATRIC: The patient has a normal affect.  DATA:   Twelve-lead EKG shows a normal sinus rhythm.  MEDICAL ISSUES:  LACERATION LEFT WRIST: This patient has active bleeding from a laceration on the left wrist. I have recommended  exploration in the operating room. If he transected the radial artery this may simply be ligated as long as the pulmonary arch is intact. On exam he does not appear to have any significant injuries to the tendons or nerves however if there are concerns intraoperatively we will ask the hand surgeons to help.   Waverly Ferrari [facility] [phone]

## 2015-11-30 NOTE — Anesthesia Postprocedure Evaluation (Signed)
Anesthesia Post Note  Patient: Ryan Carpenter  Procedure(s) Performed: Procedure(s) (LRB): EXPLORATION OF LEFT WRIST LACERATION LIGATION OF RADIAL ARTERY AND RADIAL VIENS. (Left)  Patient location during evaluation: PACU Anesthesia Type: General Level of consciousness: awake, awake and alert and oriented Pain management: pain level controlled Vital Signs Assessment: post-procedure vital signs reviewed and stable Respiratory status: spontaneous breathing, nonlabored ventilation and respiratory function stable Cardiovascular status: blood pressure returned to baseline Anesthetic complications: no    Last Vitals:  Vitals:   11/29/15 1605 11/29/15 1620  BP: (!) 158/99 (!) 157/89  Pulse: 72 81  Resp: 14 17  Temp: 36.8 C     Last Pain:  Vitals:   11/29/15 1605  TempSrc:   PainSc: 3                  Latorya Bautch COKER

## 2015-12-01 ENCOUNTER — Telehealth: Payer: Self-pay | Admitting: Vascular Surgery

## 2015-12-01 ENCOUNTER — Encounter (HOSPITAL_COMMUNITY): Payer: Self-pay | Admitting: Vascular Surgery

## 2015-12-01 NOTE — Telephone Encounter (Signed)
Spoke to partner about suture removal and changed the date to the 11th so that Dr Edilia Boickson is in the office.

## 2015-12-01 NOTE — Telephone Encounter (Signed)
-----   Message from Sharee PimpleMarilyn K McChesney, RN sent at 12/01/2015 10:25 AM EDT ----- Regarding: 2 weeks   ----- Message ----- From: Chuck Hinthristopher S Dickson, MD Sent: 11/29/2015   3:18 PM To: [facility] Charge Pool Subject: charge and f/u                                 PROCEDURE: Exploration of laceration left wrist, ligation of left radial artery and adjacent veins.  SURGEON: Di KindleChristopher S. Edilia Boickson, MD, FACS  He needs a follow up visit in 2 weeks for suture removal.  CD

## 2015-12-12 ENCOUNTER — Encounter: Payer: Self-pay | Admitting: Family

## 2015-12-17 ENCOUNTER — Ambulatory Visit (INDEPENDENT_AMBULATORY_CARE_PROVIDER_SITE_OTHER): Payer: Self-pay | Admitting: Family

## 2015-12-17 VITALS — BP 142/95 | HR 62 | Temp 97.7°F | Resp 18 | Ht 68.0 in | Wt 210.0 lb

## 2015-12-17 DIAGNOSIS — S55112D Laceration of radial artery at forearm level, left arm, subsequent encounter: Secondary | ICD-10-CM

## 2015-12-17 NOTE — Progress Notes (Signed)
    Postoperative Visit   History of Present Illness  Ryan Carpenter is a 45 y.o. male who is s/p Laceration left wrist on 12/06/15 from a roofing accident. He sustained an injury to his left wrist and had significant bleeding at the scene. He was brought to the emergency department by EMS.  On 12/06/15 Dr. Edilia Boickson performed an exploration of laceration left wrist, ligation of left radial artery and adjacent veins.  The patient had a strong ulnar signal and palmar arch signal with bleeding from the distal aspect of the transected left radial artery. This reason Dr. Edilia Boickson elected to ligate the radial artery as there was excellent perfusion.  He returns today for suture removal.  His main problem with the left hand is shooting pain, 8-10x/hour to his thumb. He reports pain in his palm when he tries to pick things up.  He is right hand dominent.   The left radial incision is well healed.   The patient is mostly able to complete his activities of daily living.     For VQI Use Only  PRE-ADM LIVING: Home  AMB STATUS: Ambulatory  Physical Examination  Vitals:   12/17/15 1149  BP: (!) 142/95  Pulse: 62  Resp: 18  Temp: 97.7 F (36.5 C)  SpO2: 98%  Weight: 210 lb (95.3 kg)  Height: 5\' 8"  (1.727 m)   Body mass index is 31.93 kg/m.  PHYSICAL EXAMINATION: General: The patient appears their stated age.   HEENT:  No gross abnormalities Pulmonary: Respirations are slightly labored at rest with audible wheezing without a stethoscope. Abdomen: Soft and non-tender Musculoskeletal: There are no major deformities.   Neurologic: Left hand grip is 4/5 with associated pain. Sensation to touch intact in all 5 fingers of left hand.  Skin: There are no ulcer or rashes noted. Psychiatric: The patient has normal affect. Cardiovascular: There is a regular rate and rhythm.  Vascular: Vessel Right Left  Radial Palpable Not Palpable  Ulnar Not Palpable Not Palpable  Brachial Palpable  Palpable    Medical Decision Making  Ryan Carpenter is a 45 y.o. male who presents s/p exploration of laceration left wrist, ligation of left radial artery and adjacent veins on 12/06/15 after he sustained an injury to his left wrist. I discussed with Dr. Edilia Boickson that pt is having pain trying to use left hand, and decreased function of left hand. Referral to hand surgeon for the above.   His left wrist incision is well healed. Sutures removed today.  Return prn.   NICKEL, Carma LairSUZANNE L, RN, MSN, FNP-C [facility] Office: [phone]  12/17/2015, 11:57 AM  Clinic MD: Edilia Boickson   Removed sutures from laceration site to left lower arm.  Incision clean, showing no sign of infection or separation. Patient is however, c/o numbness and stiffness of left hand.  Rosalita ChessmanSuzanne Nickel  NP is to evaluate patient.

## 2019-09-11 ENCOUNTER — Encounter: Payer: Self-pay | Admitting: Gastroenterology

## 2019-10-03 ENCOUNTER — Other Ambulatory Visit: Payer: Self-pay

## 2019-10-17 ENCOUNTER — Encounter: Payer: Self-pay | Admitting: Gastroenterology

## 2019-10-17 ENCOUNTER — Ambulatory Visit (INDEPENDENT_AMBULATORY_CARE_PROVIDER_SITE_OTHER): Payer: No Typology Code available for payment source | Admitting: Gastroenterology

## 2019-10-17 VITALS — BP 120/78 | HR 68 | Ht 68.0 in | Wt 213.0 lb

## 2019-10-17 DIAGNOSIS — R042 Hemoptysis: Secondary | ICD-10-CM | POA: Diagnosis not present

## 2019-10-17 DIAGNOSIS — K625 Hemorrhage of anus and rectum: Secondary | ICD-10-CM | POA: Diagnosis not present

## 2019-10-17 MED ORDER — PLENVU 140 G PO SOLR: 140.0000 g | ORAL | 0 refills | Status: DC

## 2019-10-17 NOTE — Patient Instructions (Signed)
If you are age 51 or older, your body mass index should be between 23-30. Your Body mass index is 32.39 kg/m. If this is out of the aforementioned range listed, please consider follow up with your Primary Care Provider.  If you are age 81 or younger, your body mass index should be between 19-25. Your Body mass index is 32.39 kg/m. If this is out of the aformentioned range listed, please consider follow up with your Primary Care Provider.   You have been scheduled for an endoscopy/Colonoscopy. Please follow written instructions given to you at your visit today. If you use inhalers (even only as needed), please bring them with you on the day of your procedure.  It was a pleasure to see you today!  Dr. Myrtie Neither

## 2019-10-17 NOTE — Progress Notes (Signed)
[facility] Consult Note:  History: Ryan Carpenter 10/17/2019  Referring provider: [facility]  Reason for consult/chief complaint: Gastroesophageal Reflux (Patient complains of regurgitation with small amounts of blood, 2 weeks prior) and Blood In Stools (Small amount seen in stool, possibly due to NSAID use)   Subjective  HPI:  Ryan Carpenter is a 49 year old man [facility].  [facility] referral indicates "hemoptysis and hematochezia with negative work-up in 2018". Clinic note by Arville Go dated 08/21/2019 from [facility], note states" hematochezia and melena and occasional bright red blood per rectum intermittently for the last 4 years.  Last episode of vomiting blood yesterday.  Has been drinking Goody powder nonstop for headaches.  Also on naproxen (which makes stomach worse).  Routine BM and stopped alcohol altogether"  Ryan Carpenter says he was using NSAIDs and Goody powders frequently and has stopped doing all that.  He also cut down use of cigarettes and he stopped drinking alcohol.  About 3 months back he had few episodes of small-volume hemoptysis as well as rectal bleeding.  There was some nausea and vomiting when the hemoptysis occurred.  He denies chronic abdominal pain or altered bowel habits.  He has not seen any blood in over 2 months now.  When [facility] records of brought to his attention, he recalled having a colonoscopy a few years back and may be there was some polyps.  Those actual procedure reports are not included with the [place] records. Maddax denies dysphagia or odynophagia, anorexia or weight loss.  He has been bothered by weight gain in the last several years and attributes that to being more sedentary since he can no longer work.  ROS:  Review of Systems  Constitutional: Negative for appetite change and unexpected weight change.  HENT: Negative for mouth sores and voice  change.   Eyes: Negative for pain and redness.  Respiratory: Negative for cough and shortness of breath.   Cardiovascular: Negative for chest pain and palpitations.  Genitourinary: Negative for dysuria and hematuria.  Musculoskeletal: Positive for arthralgias and back pain. Negative for myalgias.  Skin: Negative for pallor and rash.  Neurological: Negative for weakness and headaches.  Hematological: Negative for adenopathy.   Last year he was bothered by "welts" all over his arms legs and torso.  The [place] provider apparently did not know what it was and is since subsided.  He has some patchy skin discoloration in those areas as a remnant.   Past Medical History: Past Medical History:  Diagnosis Date  . Arthritis   . Asthma   . Cervical disc disease   . Chronic back pain   . Chronic headaches   . Chronic migraine   . Chronic neck pain   . COPD (chronic obstructive pulmonary disease) (HCC)   . Difficulty controlling anger   . GERD (gastroesophageal reflux disease)   . Hemorrhoids   . Hyperlipemia   . Hypertension   . Polysubstance abuse (HCC)    history of polysubstance abuse  . Sleep apnea   . Vitamin D deficiency      Past Surgical History: Past Surgical History:  Procedure Laterality Date  . ANKLE SURGERY Left 1996  . APPENDECTOMY  1989  . ARTERY REPAIR Left 11/29/2015   Procedure: EXPLORATION OF LEFT WRIST LACERATION LIGATION OF RADIAL ARTERY AND RADIAL VIENS.;  Surgeon: Chuck Hint, MD;  Location[facility] OR;  Service: Vascular;  Laterality: Left;  .  LEG SURGERY Right 1996  . WRIST SURGERY  2017     Family History: Family History  Problem Relation Age of Onset  . Prostate cancer Father     Social History: Social History   Socioeconomic History  . Marital status: Single    Spouse name: Not on file  . Number of children: 2  . Years of education: Not on file  . Highest education level: Not on file  Occupational History  . Not on file  Tobacco Use  .  Smoking status: Current Every Day Smoker    Types: Cigarettes  . Smokeless tobacco: Current User  Vaping Use  . Vaping Use: Never assessed  Substance and Sexual Activity  . Alcohol use: Yes    Comment: beer   . Drug use: Yes    Types: Marijuana  . Sexual activity: Not on file  Other Topics Concern  . Not on file  Social History Narrative  . Not on file   Social Determinants of Health   Financial Resource Strain:   . Difficulty of Paying Living Expenses:   Food Insecurity:   . Worried About Programme researcher, broadcasting/film/video in the Last Year:   . Barista in the Last Year:   Transportation Needs:   . Freight forwarder (Medical):   Marland Kitchen Lack of Transportation (Non-Medical):   Physical Activity:   . Days of Exercise per Week:   . Minutes of Exercise per Session:   Stress:   . Feeling of Stress :   Social Connections:   . Frequency of Communication with Friends and Family:   . Frequency of Social Gatherings with Friends and Family:   . Attends Religious Services:   . Active Member of Clubs or Organizations:   . Attends Banker Meetings:   Marland Kitchen Marital Status:     Allergies: No Known Allergies  Outpatient Meds: Current Outpatient Medications  Medication Sig Dispense Refill  . amitriptyline (ELAVIL) 10 MG tablet Take 10 mg by mouth at bedtime.    . budesonide-formoterol (SYMBICORT) 160-4.5 MCG/ACT inhaler Inhale into the lungs.    . cephALEXin (KEFLEX) 500 MG capsule Take 1 capsule (500 mg total) by mouth 4 (four) times daily. 40 capsule 0  . cetirizine (ZYRTEC) 10 MG tablet Take 10 mg by mouth daily.    . ergocalciferol (VITAMIN D2) 1.25 MG (50000 UT) capsule Take by mouth.    . gabapentin (NEURONTIN) 300 MG capsule Take by mouth.    . montelukast (SINGULAIR) 10 MG tablet Take by mouth.    Marland Kitchen omeprazole (PRILOSEC) 20 MG capsule Take by mouth.    . oxyCODONE-acetaminophen (ROXICET) 5-325 MG tablet Take 1-2 tablets by mouth every 4 (four) hours as needed. 25 tablet 0   . pravastatin (PRAVACHOL) 40 MG tablet Take by mouth.    . SUMAtriptan (IMITREX) 100 MG tablet Take by mouth.    . SUMAtriptan Succinate 6 MG/0.5ML SOTJ Inject into the skin.    . Tiotropium Bromide Monohydrate 2.5 MCG/ACT AERS Inhale into the lungs.    . verapamil (CALAN) 120 MG tablet Take by mouth.     No current facility-administered medications for this visit.      ___________________________________________________________________ Objective   Exam:  BP 120/78   Pulse 68   Ht 5\' 8"  (1.727 m)   Wt 213 lb (96.6 kg)   BMI 32.39 kg/m    General: No acute distress.  Breathing comfortably on room air, ambulatory and conversational  Eyes: sclera  anicteric, no redness  ENT: oral mucosa moist without lesions, no cervical or supraclavicular lymphadenopathy  CV: RRR without murmur, S1/S2, no JVD, no peripheral edema  Resp: clear to auscultation bilaterally, normal RR and effort noted  GI: soft, no tenderness, with active bowel sounds. No guarding or palpable organomegaly noted.  Skin; warm and dry, no rash or jaundice noted.  Nonspecific patchy skin changes as noted above  Neuro: awake, alert and oriented x 3. Normal gross motor function and fluent speech  Labs:  Labs dated 08/21/2019: WBC 9.5, hemoglobin 16.3, hematocrit 47, platelet 298 AST 22, ALT 12, total bilirubin 0.2 ([facility] pages of [facility] records reviewed)   Assessment: Encounter Diagnoses  Name Primary?  . Hemoptysis Yes  . Rectal bleeding     Hemoptysis and rectal bleeding, both occurring in the setting of excess alcohol and NSAID use, symptoms now resolved after patient stopped NSAIDs and alcohol.  Nevertheless, endoscopic work-up warranted to investigate any persistent abnormalities such as neoplasia or ulcer.   Plan:  EGD and colonoscopy.  He was agreeable after discussion of procedure and risks.  The benefits and risks of the planned procedure were described in detail with the patient or (when  appropriate) their health care proxy.  Risks were outlined as including, but not limited to, bleeding, infection, perforation, adverse medication reaction leading to cardiac or pulmonary decompensation, pancreatitis (if ERCP).  The limitation of incomplete mucosal visualization was also discussed.  No guarantees or warranties were given.  No Covid vaccine, so we will test him 2 days prior.  Thank you for the courtesy of this consult.  Please call me with any questions or concerns.  Charlie Pitter III  CC: Referring provider noted above

## 2019-11-16 ENCOUNTER — Other Ambulatory Visit: Payer: Self-pay | Admitting: Gastroenterology

## 2019-11-16 ENCOUNTER — Ambulatory Visit (INDEPENDENT_AMBULATORY_CARE_PROVIDER_SITE_OTHER): Payer: Non-veteran care

## 2019-11-16 ENCOUNTER — Telehealth: Payer: Self-pay | Admitting: Gastroenterology

## 2019-11-16 DIAGNOSIS — Z1159 Encounter for screening for other viral diseases: Secondary | ICD-10-CM

## 2019-11-16 LAB — SARS CORONAVIRUS 2 (TAT 6-24 HRS): SARS Coronavirus 2: NEGATIVE

## 2019-11-16 NOTE — Telephone Encounter (Signed)
Patient really couldn't explain why he couldn't get his prep from the pharmacy, procedure is scheduled for Tuesday the 14th of Sept. He will come to the office Monday and pick up a bowel prep.

## 2019-11-20 ENCOUNTER — Other Ambulatory Visit: Payer: Self-pay

## 2019-11-20 ENCOUNTER — Ambulatory Visit (AMBULATORY_SURGERY_CENTER): Payer: No Typology Code available for payment source | Admitting: Gastroenterology

## 2019-11-20 ENCOUNTER — Encounter: Payer: Self-pay | Admitting: Gastroenterology

## 2019-11-20 VITALS — BP 137/95 | HR 77 | Temp 98.2°F | Resp 12 | Ht 68.0 in | Wt 213.0 lb

## 2019-11-20 DIAGNOSIS — K921 Melena: Secondary | ICD-10-CM | POA: Diagnosis not present

## 2019-11-20 DIAGNOSIS — K625 Hemorrhage of anus and rectum: Secondary | ICD-10-CM | POA: Diagnosis not present

## 2019-11-20 DIAGNOSIS — R042 Hemoptysis: Secondary | ICD-10-CM

## 2019-11-20 HISTORY — PX: COLONOSCOPY: SHX174

## 2019-11-20 MED ORDER — SODIUM CHLORIDE 0.9 % IV SOLN: 500.0000 mL | Freq: Once | INTRAVENOUS | Status: DC

## 2019-11-20 NOTE — Progress Notes (Signed)
Pt's states no medical or surgical changes since previsit or office visit.  Vitals AB 

## 2019-11-20 NOTE — Patient Instructions (Addendum)
The office will call you to reschedule your colonoscopy.  You will need to have another colonoscopy in 6 months.  Read all of the handouts given to you by your recovery room nurse.  YOU HAD AN ENDOSCOPIC PROCEDURE TODAY AT THE  ENDOSCOPY CENTER:   Refer to the procedure report that was given to you for any specific questions about what was found during the examination.  If the procedure report does not answer your questions, please call your gastroenterologist to clarify.  If you requested that your care partner not be given the details of your procedure findings, then the procedure report has been included in a sealed envelope for you to review at your convenience later.  YOU SHOULD EXPECT: Some feelings of bloating in the abdomen. Passage of more gas than usual.  Walking can help get rid of the air that was put into your GI tract during the procedure and reduce the bloating. If you had a lower endoscopy (such as a colonoscopy or flexible sigmoidoscopy) you may notice spotting of blood in your stool or on the toilet paper. If you underwent a bowel prep for your procedure, you may not have a normal bowel movement for a few days.  Please Note:  You might notice some irritation and congestion in your nose or some drainage.  This is from the oxygen used during your procedure.  There is no need for concern and it should clear up in a day or so.  SYMPTOMS TO REPORT IMMEDIATELY:   Following lower endoscopy (colonoscopy or flexible sigmoidoscopy):  Excessive amounts of blood in the stool  Significant tenderness or worsening of abdominal pains  Swelling of the abdomen that is new, acute  Fever of 100F or higher   Following upper endoscopy (EGD)  Vomiting of blood or coffee ground material  New chest pain or pain under the shoulder blades  Painful or persistently difficult swallowing  New shortness of breath  Fever of 100F or higher  Black, tarry-looking stools  For urgent or emergent  issues, a gastroenterologist can be reached at any hour by calling [phone]. Do not use MyChart messaging for urgent concerns.    DIET:  We do recommend a small meal at first, but then you may proceed to your regular diet.  Drink plenty of fluids but you should avoid alcoholic beverages for 24 hours.  ACTIVITY:  You should plan to take it easy for the rest of today and you should NOT DRIVE or use heavy machinery until tomorrow (because of the sedation medicines used during the test).    FOLLOW UP: Our staff will call the number listed on your records 48-72 hours following your procedure to check on you and address any questions or concerns that you may have regarding the information given to you following your procedure. If we do not reach you, we will leave a message.  We will attempt to reach you two times.  During this call, we will ask if you have developed any symptoms of COVID 19. If you develop any symptoms (ie: fever, flu-like symptoms, shortness of breath, cough etc.) before then, please call [phone].  If you test positive for Covid 19 in the 2 weeks post procedure, please call and report this information to [place].    If any biopsies were taken you will be contacted by phone or by letter within the next 1-3 weeks.  Please call us at [phone] if you have not heard about the biopsies in 3  weeks.    SIGNATURES/CONFIDENTIALITY: You and/or your care partner have signed paperwork which will be entered into your electronic medical record.  These signatures attest to the fact that that the information above on your After Visit Summary has been reviewed and is understood.  Full responsibility of the confidentiality of this discharge information lies with you and/or your care-partner.  No NSAIDS until further notice, and decrease your alcohol intake. Per Dr Myrtie Neither.

## 2019-11-20 NOTE — Op Note (Signed)
[facility] Patient Name: Ryan StallionJames Carpenter Procedure Date: 11/20/2019 2:12 PM MRN: [id number] Endoscopist: Sherilyn CooterHenry L. Myrtie Neitheranis , MD Age: [age] Referring MD:  Date of Birth: Mar 02, 1971 Gender: Male Account #: [id number] Procedure:                Colonoscopy Indications:              Rectal bleeding (single episode) - unclear history                            [facility] Medicines:                Monitored Anesthesia Care Procedure:                Pre-Anesthesia Assessment:                           - Prior to the procedure, a History and Physical                            was performed, and patient medications and                            allergies were reviewed. The patient's tolerance of                            previous anesthesia was also reviewed. The risks                            and benefits of the procedure and the sedation                            options and risks were discussed with the patient.                            All questions were answered, and informed consent                            was obtained. Prior Anticoagulants: The patient has                            taken no previous anticoagulant or antiplatelet                            agents. ASA Grade Assessment: III - A patient with                            severe systemic disease. After reviewing the risks                            and benefits, the patient was deemed in                            satisfactory condition to undergo the procedure.  After obtaining informed consent, the colonoscope                            was passed under direct vision. Throughout the                            procedure, the patient's blood pressure, pulse, and                            oxygen saturations were monitored continuously. The                            Colonoscope was introduced through the anus and                            advanced to the the  cecum, identified by                            appendiceal orifice and ileocecal valve. The                            colonoscopy was performed without difficulty. The                            patient tolerated the procedure well. The quality                            of the bowel preparation was poor. The ileocecal                            valve, appendiceal orifice, and rectum were                            photographed. The bowel preparation used was Plenvu. Scope In: 2:27:27 PM Scope Out: 2:35:19 PM Scope Withdrawal Time: 0 hours 2 minutes 29 seconds  Total Procedure Duration: 0 hours 7 minutes 52 seconds  Findings:                 The perianal and digital rectal examinations were                            normal.                           A large amount of semi-solid stool was found in the                            entire colon, precluding visualization.                           The exam was otherwise without abnormality on                            direct and retroflexion views. Complications:  No immediate complications. Estimated Blood Loss:     Estimated blood loss: none. Impression:               - Preparation of the colon was poor.                           - Stool in the entire examined colon.                           - The examination was otherwise normal on direct                            and retroflexion views.                           - No specimens collected.                           Self-limited benign ano-rectal bleeding. Recommendation:           - Patient has a contact number available for                            emergencies. The signs and symptoms of potential                            delayed complications were discussed with the                            patient. Return to normal activities tomorrow.                            Written discharge instructions were provided to the                            patient.                            - Resume previous diet.                           - Continue present medications.                           - Repeat colonoscopy in 6 months for surveillance.                            2-DAY PREP AND STRICT ATTENTION TO INSTRUCTIONS FOR                            NEXT EXAM                           - See the other procedure note for documentation of                            additional recommendations. Safire Gordin L. Danis,  MD 11/20/2019 2:51:33 PM This report has been signed electronically.

## 2019-11-20 NOTE — Progress Notes (Signed)
pt tolerated well. VSS. awake and to recovery. Report given to RN. Oral bite block inserted and removed without trauma. 

## 2019-11-20 NOTE — Progress Notes (Signed)
The patient did pull out his IV in recovery.  Bandaid applied.  Pt is fine.

## 2019-11-20 NOTE — Op Note (Signed)
[facility] Patient Name: Ryan Carpenter Procedure Date: 11/20/2019 2:12 PM MRN: [id number] Endoscopist: Sherilyn Cooter L. Myrtie Neither , MD Age: 49 Referring MD:  Date of Birth: 02-Jan-1971 Gender: Male Account #: [id number] Procedure:                Upper GI endoscopy Indications:              Hematemesis (single episdoe during vomiting) -                            recent clinic note with clinical details. Medicines:                Monitored Anesthesia Care Procedure:                Pre-Anesthesia Assessment:                           - Prior to the procedure, a History and Physical                            was performed, and patient medications and                            allergies were reviewed. The patient's tolerance of                            previous anesthesia was also reviewed. The risks                            and benefits of the procedure and the sedation                            options and risks were discussed with the patient.                            All questions were answered, and informed consent                            was obtained. Prior Anticoagulants: The patient has                            taken no previous anticoagulant or antiplatelet                            agents. ASA Grade Assessment: III - A patient with                            severe systemic disease. After reviewing the risks                            and benefits, the patient was deemed in                            satisfactory condition to undergo the procedure.  After obtaining informed consent, the endoscope was                            passed under direct vision. Throughout the                            procedure, the patient's blood pressure, pulse, and                            oxygen saturations were monitored continuously. The                            Endoscope was introduced through the mouth, and                            advanced to the  second part of duodenum. The upper                            GI endoscopy was accomplished without difficulty.                            The patient tolerated the procedure well. Scope In: Scope Out: Findings:                 The esophagus was normal.                           The stomach was normal.                           The cardia and gastric fundus were normal on                            retroflexion.                           The examined duodenum was normal. Complications:            No immediate complications. Estimated Blood Loss:     Estimated blood loss: none. Impression:               - Normal esophagus.                           - Normal stomach.                           - Normal examined duodenum.                           - No specimens collected.                           No source of hemoptsis was seen. Recommendation:           - Patient has a contact number available for  emergencies. The signs and symptoms of potential                            delayed complications were discussed with the                            patient. Return to normal activities tomorrow.                            Written discharge instructions were provided to the                            patient.                           - Resume previous diet.                           - Continue present medications.                           - See the other procedure note for documentation of                            additional recommendations.                           - Minimize alcohol use and avoid aspirin and NSAIDs                            (including Goody powder) Hollister Wessler L. Myrtie Neither, MD 11/20/2019 2:54:50 PM This report has been signed electronically.

## 2019-11-22 ENCOUNTER — Telehealth: Payer: Self-pay

## 2019-11-22 NOTE — Telephone Encounter (Signed)
  Follow up Call-  Call back number 11/20/2019  Post procedure Call Back phone  # [phone]  Permission to leave phone message Yes  Some recent data might be hidden     Patient questions:  Do you have a fever, pain , or abdominal swelling? No. Pain Score  0 *  Have you tolerated food without any problems? Yes.    Have you been able to return to your normal activities? Yes.    Do you have any questions about your discharge instructions: Diet   No. Medications  No. Follow up visit  No.  Do you have questions or concerns about your Care? No.  Actions: * If pain score is 4 or above: 1. No action needed, pain <4.Have you developed a fever since your procedure? no  2.   Have you had an respiratory symptoms (SOB or cough) since your procedure? no  3.   Have you tested positive for COVID 19 since your procedure no  4.   Have you had any family members/close contacts diagnosed with the COVID 19 since your procedure?  no   If yes to any of these questions please route to Laverna Peace, RN and Karlton Lemon, RN

## 2020-10-21 ENCOUNTER — Encounter: Payer: Self-pay | Admitting: Gastroenterology
